# Patient Record
Sex: Male | Born: 1995
Health system: Southern US, Community
[De-identification: ages and names within clinical notes are randomized; demographics above are authoritative.]

## PROBLEM LIST (undated history)

## (undated) HISTORY — PX: OTHER SURGICAL HISTORY: SHX169

---
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
## 2016-07-18 ENCOUNTER — Ambulatory Visit
Admission: RE | Admit: 2016-07-18 | Discharge: 2016-07-18 | Disposition: A | Discharge status: Home / Self Care | Payer: No Typology Code available for payment source | Source: Ambulatory Visit | Attending: Occupational Medicine | Admitting: Occupational Medicine

## 2016-07-18 ENCOUNTER — Other Ambulatory Visit: Payer: Self-pay | Admitting: Occupational Medicine

## 2016-07-18 DIAGNOSIS — Z021 Encounter for pre-employment examination: Secondary | ICD-10-CM

## 2017-01-05 DIAGNOSIS — M9901 Segmental and somatic dysfunction of cervical region: Secondary | ICD-10-CM | POA: Diagnosis not present

## 2017-01-05 DIAGNOSIS — M9903 Segmental and somatic dysfunction of lumbar region: Secondary | ICD-10-CM | POA: Diagnosis not present

## 2017-01-05 DIAGNOSIS — M9902 Segmental and somatic dysfunction of thoracic region: Secondary | ICD-10-CM | POA: Diagnosis not present

## 2017-01-17 DIAGNOSIS — M9902 Segmental and somatic dysfunction of thoracic region: Secondary | ICD-10-CM | POA: Diagnosis not present

## 2017-01-17 DIAGNOSIS — M9901 Segmental and somatic dysfunction of cervical region: Secondary | ICD-10-CM | POA: Diagnosis not present

## 2017-01-17 DIAGNOSIS — M9903 Segmental and somatic dysfunction of lumbar region: Secondary | ICD-10-CM | POA: Diagnosis not present

## 2017-01-24 DIAGNOSIS — Z833 Family history of diabetes mellitus: Secondary | ICD-10-CM | POA: Diagnosis not present

## 2017-01-24 DIAGNOSIS — M25511 Pain in right shoulder: Secondary | ICD-10-CM | POA: Diagnosis not present

## 2017-02-07 DIAGNOSIS — M9902 Segmental and somatic dysfunction of thoracic region: Secondary | ICD-10-CM | POA: Diagnosis not present

## 2017-02-07 DIAGNOSIS — M9901 Segmental and somatic dysfunction of cervical region: Secondary | ICD-10-CM | POA: Diagnosis not present

## 2017-02-07 DIAGNOSIS — M9903 Segmental and somatic dysfunction of lumbar region: Secondary | ICD-10-CM | POA: Diagnosis not present

## 2017-03-16 IMAGING — CR DG CHEST 1V
1 series · 1 of 1 positions shown · non-contrast
Comparison: No priors.

CLINICAL DATA: 20-year-old male undergoing pre-employment
evaluation.

EXAM:
CHEST 1 VIEW

[w chest pa]
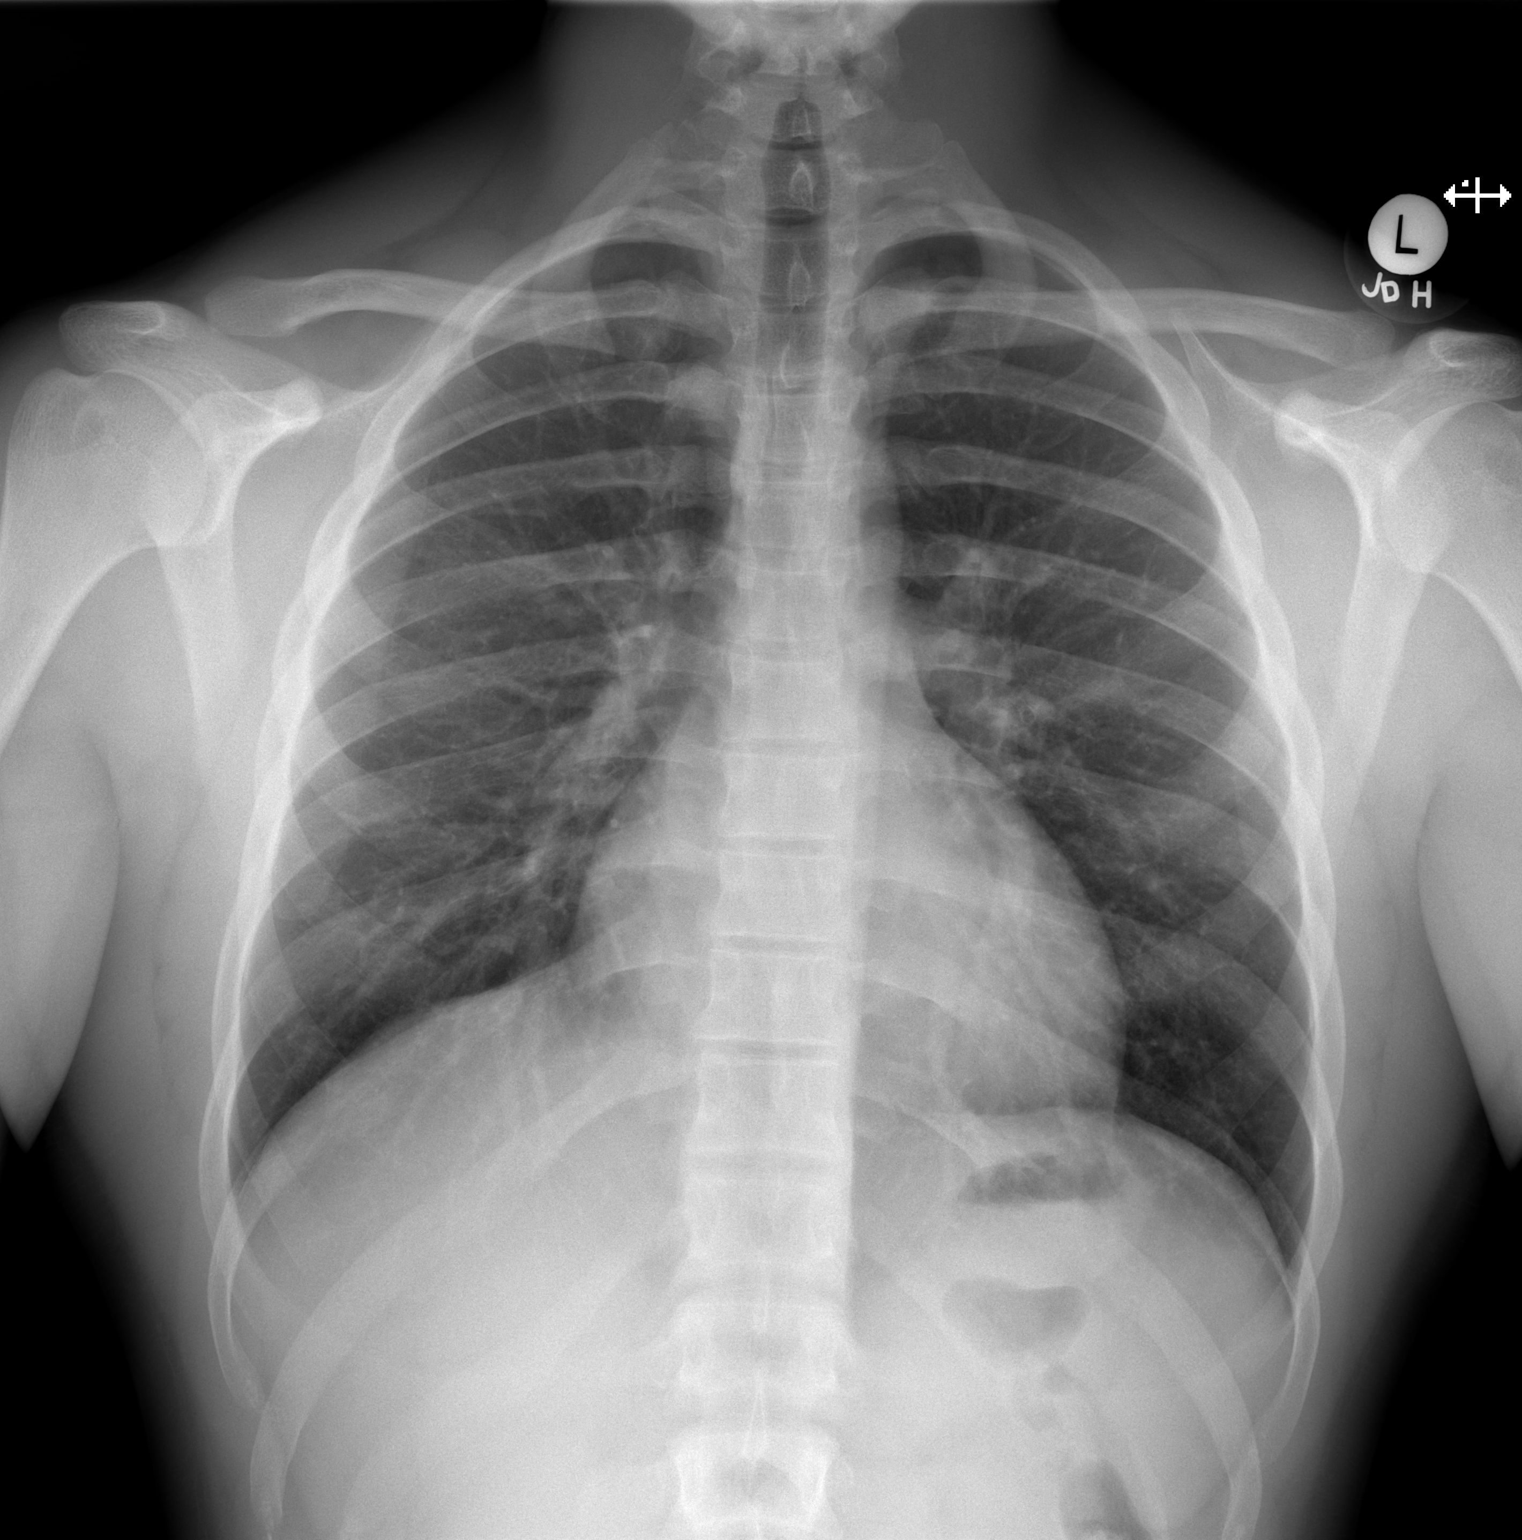

[1 of 1 positions shown; findings below may reference images not displayed]

FINDINGS: Lung volumes are normal. No consolidative airspace disease. No
pleural effusions. No pneumothorax. No pulmonary nodule or mass
noted. Pulmonary vasculature and the cardiomediastinal silhouette
are within normal limits.
IMPRESSION: No radiographic evidence of acute cardiopulmonary disease.

## 2020-01-13 DIAGNOSIS — M9901 Segmental and somatic dysfunction of cervical region: Secondary | ICD-10-CM | POA: Diagnosis not present

## 2020-01-16 DIAGNOSIS — M9901 Segmental and somatic dysfunction of cervical region: Secondary | ICD-10-CM | POA: Diagnosis not present

## 2020-01-20 DIAGNOSIS — M9901 Segmental and somatic dysfunction of cervical region: Secondary | ICD-10-CM | POA: Diagnosis not present

## 2020-01-23 DIAGNOSIS — M9901 Segmental and somatic dysfunction of cervical region: Secondary | ICD-10-CM | POA: Diagnosis not present

## 2020-04-07 DIAGNOSIS — M7541 Impingement syndrome of right shoulder: Secondary | ICD-10-CM | POA: Diagnosis not present

## 2020-04-07 DIAGNOSIS — M9907 Segmental and somatic dysfunction of upper extremity: Secondary | ICD-10-CM | POA: Diagnosis not present

## 2020-04-07 DIAGNOSIS — M9902 Segmental and somatic dysfunction of thoracic region: Secondary | ICD-10-CM | POA: Diagnosis not present

## 2020-04-07 DIAGNOSIS — M9901 Segmental and somatic dysfunction of cervical region: Secondary | ICD-10-CM | POA: Diagnosis not present

## 2021-07-01 ENCOUNTER — Encounter: Payer: Self-pay | Admitting: Nurse Practitioner

## 2021-07-01 NOTE — Progress Notes (Deleted)
Subjective:  Patient ID: Samuel Lucero, male    DOB: 1996-06-11  Age: 25 y.o. MRN: [id number]  No chief complaint on file.   HPI  Well Adult Physical: Patient here for a comprehensive physical exam.The patient reports {problems:16946} Do you take any herbs or supplements that were not prescribed by a doctor? {yes/no/not asked:9010} Are you taking calcium supplements? {yes/no:63} Are you taking aspirin daily? {yes/no:63}  Encounter for general adult medical examination without abnormal findings  Physical ("At Risk" items are starred): Patient's last physical exam was 1 year ago .  Smoking: Life-long non-smoker ;  Physical Activity: Exercises at least 3 times per week ;  Alcohol/Drug Use: Is a non-drinker ; No illicit drug use ;  Patient is not afflicted from Stress Incontinence and Urge Incontinence  Safety: reviewed ; Patient wears a seat belt, has smoke detectors, has carbon monoxide detectors, practices appropriate gun safety, and wears sunscreen with extended sun exposure. Dental Care: biannual cleanings, brushes and flosses daily. Ophthalmology/Optometry: Annual visit.  Hearing loss: none Vision impairments: none Last PSA:            Social History   Socioeconomic History   Marital status: Not on file    Spouse name: Not on file   Number of children: Not on file   Years of education: Not on file   Highest education level: Not on file  Occupational History   Not on file  Tobacco Use   Smoking status: Not on file   Smokeless tobacco: Not on file  Substance and Sexual Activity   Alcohol use: Not on file   Drug use: Not on file   Sexual activity: Not on file  Other Topics Concern   Not on file  Social History Narrative   ** Merged History Encounter **       Social Determinants of Health   Financial Resource Strain: Not on file  Food Insecurity: Not on file  Transportation Needs: Not on file  Physical Activity: Not on file  Stress: Not on file  Social  Connections: Not on file   No past medical history on file. *** The histories are not reviewed yet. Please review them in the "History" navigator section and refresh this SmartLink.  No family history on file. Social History   Socioeconomic History   Marital status: Not on file    Spouse name: Not on file   Number of children: Not on file   Years of education: Not on file   Highest education level: Not on file  Occupational History   Not on file  Tobacco Use   Smoking status: Not on file   Smokeless tobacco: Not on file  Substance and Sexual Activity   Alcohol use: Not on file   Drug use: Not on file   Sexual activity: Not on file  Other Topics Concern   Not on file  Social History Narrative   ** Merged History Encounter **       Social Determinants of Health   Financial Resource Strain: Not on file  Food Insecurity: Not on file  Transportation Needs: Not on file  Physical Activity: Not on file  Stress: Not on file  Social Connections: Not on file   Review of Systems  Constitutional:  Negative for appetite change, fatigue and fever.  HENT:  Negative for congestion, ear pain and sore throat.   Respiratory:  Negative for cough and shortness of breath.   Cardiovascular:  Negative for chest pain and leg  swelling.  Gastrointestinal:  Negative for abdominal pain, constipation, diarrhea, nausea and vomiting.  Genitourinary:  Negative for dysuria and frequency.  Musculoskeletal:  Negative for arthralgias and myalgias.  Neurological:  Negative for dizziness and headaches.  Psychiatric/Behavioral:  Negative for dysphoric mood. The patient is not nervous/anxious.     Objective:  There were no vitals taken for this visit.  No flowsheet data found.  Physical Exam Vitals reviewed.  Constitutional:      Appearance: Normal appearance.  HENT:     Right Ear: Tympanic membrane and external ear normal.     Left Ear: Tympanic membrane and external ear normal.     Nose: Nose  normal.     Mouth/Throat:     Mouth: Mucous membranes are moist.  Eyes:     Pupils: Pupils are equal, round, and reactive to light.  Cardiovascular:     Rate and Rhythm: Normal rate and regular rhythm.     Pulses: Normal pulses.     Heart sounds: Normal heart sounds.  Pulmonary:     Breath sounds: Normal breath sounds.  Abdominal:     General: Abdomen is flat. Bowel sounds are normal.     Palpations: Abdomen is soft.  Musculoskeletal:        General: Normal range of motion.     Cervical back: Normal range of motion.  Skin:    General: Skin is warm and dry.  Neurological:     Mental Status: He is alert and oriented to person, place, and time.  Psychiatric:        Mood and Affect: Mood normal.        Behavior: Behavior normal.    No results found for: WBC, HGB, HCT, PLT, GLUCOSE, CHOL, TRIG, HDL, LDLDIRECT, LDLCALC, ALT, AST, NA, K, CL, CREATININE, BUN, CO2, TSH, PSA, INR, GLUF, HGBA1C, MICROALBUR    Assessment & Plan:  There are no diagnoses linked to this encounter.   There is no height or weight on file to calculate BMI.   These are the goals we discussed:  Goals   None      This is a list of the screening recommended for you and due dates:  Health Maintenance  Topic Date Due   HPV Vaccine (1 - Male 2-dose series) Never done   HIV Screening  Never done   Hepatitis C Screening: USPSTF Recommendation to screen - Ages 30-79 yo.  Never done   Tetanus Vaccine  Never done   Flu Shot  07/26/2021   Pneumococcal Vaccination  Aged Out     AN INDIVIDUALIZED CARE PLAN: was established or reinforced today.   SELF MANAGEMENT: The patient and I together assessed ways to personally work towards obtaining the recommended goals  Support needs The patient and/or family needs were assessed and services were offered if appropriate.  No orders of the defined types were placed in this encounter.    Follow-up: No follow-ups on file.  An After Visit Summary was printed and given  to the patient.    I,Bethann Qualley M Neema Fluegge,acting as a Neurosurgeon for [employer], NP.,have documented all relevant documentation on the behalf of Samuel Morning, NP,as directed by  Samuel Morning, NP while in the presence of Samuel Morning, NP.   Samuel Morning, NP [facility] [phone]

## 2021-07-02 ENCOUNTER — Ambulatory Visit (INDEPENDENT_AMBULATORY_CARE_PROVIDER_SITE_OTHER): Payer: BC Managed Care – PPO | Admitting: Nurse Practitioner

## 2021-07-02 ENCOUNTER — Other Ambulatory Visit: Payer: Self-pay

## 2021-07-02 ENCOUNTER — Encounter: Payer: Self-pay | Admitting: Nurse Practitioner

## 2021-07-02 VITALS — BP 110/62 | HR 81 | Temp 97.8°F | Ht 67.0 in | Wt 198.0 lb

## 2021-07-02 DIAGNOSIS — H6123 Impacted cerumen, bilateral: Secondary | ICD-10-CM

## 2021-07-02 DIAGNOSIS — Z0000 Encounter for general adult medical examination without abnormal findings: Secondary | ICD-10-CM | POA: Diagnosis not present

## 2021-07-02 NOTE — Patient Instructions (Signed)
Rest, ice, elevate left knee Apply Voltaren gel to left knee 4 times daily for pain May wear compression sleeve to left knee Tylenol/Ibuprofen as needed for knee pain Follow-up 1 year  Health Maintenance, Male Adopting a healthy lifestyle and getting preventive care are important in promoting health and wellness. Ask your health care provider about: The right schedule for you to have regular tests and exams. Things you can do on your own to prevent diseases and keep yourself healthy. What should I know about diet, weight, and exercise? Eat a healthy diet  Eat a diet that includes plenty of vegetables, fruits, low-fat dairy products, and lean protein. Do not eat a lot of foods that are high in solid fats, added sugars, or sodium.  Maintain a healthy weight Body mass index (BMI) is a measurement that can be used to identify possible weight problems. It estimates body fat based on height and weight. Your health care provider can help determine your BMI and help you achieve or maintain ahealthy weight. Get regular exercise Get regular exercise. This is one of the most important things you can do for your health. Most adults should: Exercise for at least 150 minutes each week. The exercise should increase your heart rate and make you sweat (moderate-intensity exercise). Do strengthening exercises at least twice a week. This is in addition to the moderate-intensity exercise. Spend less time sitting. Even light physical activity can be beneficial. Watch cholesterol and blood lipids Have your blood tested for lipids and cholesterol at 25 years of age, then havethis test every 5 years. You may need to have your cholesterol levels checked more often if: Your lipid or cholesterol levels are high. You are older than 25 years of age. You are at high risk for heart disease. What should I know about cancer screening? Many types of cancers can be detected early and may often be prevented. Depending on  your health history and family history, you may need to have cancer screening at various ages. This may include screening for: Colorectal cancer. Prostate cancer. Skin cancer. Lung cancer. What should I know about heart disease, diabetes, and high blood pressure? Blood pressure and heart disease High blood pressure causes heart disease and increases the risk of stroke. This is more likely to develop in people who have high blood pressure readings, are of African descent, or are overweight. Talk with your health care provider about your target blood pressure readings. Have your blood pressure checked: Every 3-5 years if you are 33-44 years of age. Every year if you are 61 years old or older. If you are between the ages of 45 and 61 and are a current or former smoker, ask your health care provider if you should have a one-time screening for abdominal aortic aneurysm (AAA). Diabetes Have regular diabetes screenings. This checks your fasting blood sugar level. Have the screening done: Once every three years after age 59 if you are at a normal weight and have a low risk for diabetes. More often and at a younger age if you are overweight or have a high risk for diabetes. What should I know about preventing infection? Hepatitis B If you have a higher risk for hepatitis B, you should be screened for this virus. Talk with your health care provider to find out if you are at risk forhepatitis B infection. Hepatitis C Blood testing is recommended for: Everyone born from [birth year] through 1965. Anyone with known risk factors for hepatitis C. Sexually transmitted infections (STIs)  You should be screened each year for STIs, including gonorrhea and chlamydia, if: You are sexually active and are younger than 25 years of age. You are older than 25 years of age and your health care provider tells you that you are at risk for this type of infection. Your sexual activity has changed since you were last screened,  and you are at increased risk for chlamydia or gonorrhea. Ask your health care provider if you are at risk. Ask your health care provider about whether you are at high risk for HIV. Your health care provider may recommend a prescription medicine to help prevent HIV infection. If you choose to take medicine to prevent HIV, you should first get tested for HIV. You should then be tested every 3 months for as long as you are taking the medicine. Follow these instructions at home: Lifestyle Do not use any products that contain nicotine or tobacco, such as cigarettes, e-cigarettes, and chewing tobacco. If you need help quitting, ask your health care provider. Do not use street drugs. Do not share needles. Ask your health care provider for help if you need support or information about quitting drugs. Alcohol use Do not drink alcohol if your health care provider tells you not to drink. If you drink alcohol: Limit how much you have to 0-2 drinks a day. Be aware of how much alcohol is in your drink. In the [place] one drink equals one 12 oz bottle of beer (355 mL), one 5 oz glass of wine (148 mL), or one 1 oz glass of hard liquor (44 mL). General instructions Schedule regular health, dental, and eye exams. Stay current with your vaccines. Tell your health care provider if: You often feel depressed. You have ever been abused or do not feel safe at home. Summary Adopting a healthy lifestyle and getting preventive care are important in promoting health and wellness. Follow your health care provider's instructions about healthy diet, exercising, and getting tested or screened for diseases. Follow your health care provider's instructions on monitoring your cholesterol and blood pressure. This information is not intended to replace advice given to you by your health care provider. Make sure you discuss any questions you have with your healthcare provider. Document Revised: 12/05/2018 Document Reviewed:  12/05/2018 Elsevier Patient Education  2022 [employer]. Preventive Care 69-37 Years Old, Male Preventive care refers to lifestyle choices and visits with your health care provider that can promote health and wellness. This includes: A yearly physical exam. This is also called an annual wellness visit. Regular dental and eye exams. Immunizations. Screening for certain conditions. Healthy lifestyle choices, such as: Eating a healthy diet. Getting regular exercise. Not using drugs or products that contain nicotine and tobacco. Limiting alcohol use. What can I expect for my preventive care visit? Physical exam Your health care provider may check your: Height and weight. These may be used to calculate your BMI (body mass index). BMI is a measurement that tells if you are at a healthy weight. Heart rate and blood pressure. Body temperature. Skin for abnormal spots. Counseling Your health care provider may ask you questions about your: Past medical problems. Family's medical history. Alcohol, tobacco, and drug use. Emotional well-being. Home life and relationship well-being. Sexual activity. Diet, exercise, and sleep habits. Work and work Astronomer. Access to firearms. What immunizations do I need?  Vaccines are usually given at various ages, according to a schedule. Your health care provider will recommend vaccines for you based on your age, medicalhistory,  and lifestyle or other factors, such as travel or where you work. What tests do I need? Blood tests Lipid and cholesterol levels. These may be checked every 5 years starting at age 28. Hepatitis C test. Hepatitis B test. Screening  Diabetes screening. This is done by checking your blood sugar (glucose) after you have not eaten for a while (fasting). Genital exam to check for testicular cancer or hernias. STD (sexually transmitted disease) testing, if you are at risk. Talk with your health care provider about your test  results, treatment options,and if necessary, the need for more tests. Follow these instructions at home: Eating and drinking  Eat a healthy diet that includes fresh fruits and vegetables, whole grains, lean protein, and low-fat dairy products. Drink enough fluid to keep your urine pale yellow. Take vitamin and mineral supplements as recommended by your health care provider. Do not drink alcohol if your health care provider tells you not to drink. If you drink alcohol: Limit how much you have to 0-2 drinks a day. Be aware of how much alcohol is in your drink. In the [place] one drink equals one 12 oz bottle of beer (355 mL), one 5 oz glass of wine (148 mL), or one 1 oz glass of hard liquor (44 mL).  Lifestyle Take daily care of your teeth and gums. Brush your teeth every morning and night with fluoride toothpaste. Floss one time each day. Stay active. Exercise for at least 30 minutes 5 or more days each week. Do not use any products that contain nicotine or tobacco, such as cigarettes, e-cigarettes, and chewing tobacco. If you need help quitting, ask your health care provider. Do not use drugs. If you are sexually active, practice safe sex. Use a condom or other form of protection to prevent STIs (sexually transmitted infections). Find healthy ways to cope with stress, such as: Meditation, yoga, or listening to music. Journaling. Talking to a trusted person. Spending time with friends and family. Safety Always wear your seat belt while driving or riding in a vehicle. Do not drive: If you have been drinking alcohol. Do not ride with someone who has been drinking. When you are tired or distracted. While texting. Wear a helmet and other protective equipment during sports activities. If you have firearms in your house, make sure you follow all gun safety procedures. Seek help if you have been physically or sexually abused. What's next? Go to your health care provider once a year for an  annual wellness visit. Ask your health care provider how often you should have your eyes and teeth checked. Stay up to date on all vaccines. This information is not intended to replace advice given to you by your health care provider. Make sure you discuss any questions you have with your healthcare provider. Document Revised: 08/28/2019 Document Reviewed: 12/06/2018 Elsevier Patient Education  2022 [employer].

## 2021-07-02 NOTE — Progress Notes (Signed)
Subjective:  Patient ID: Samuel Lucero, male    DOB: October 20, 1996  Age: 25 y.o. MRN: [id number]  Chief Complaint  Patient presents with   Annual Exam    Physical paperwork for Fostering/Adopting    HPI  Well Adult Physical: Patient here for a comprehensive physical exam.The patient reports no chronic medical problems. He does tell me that he is experiencing left knee pain and swelling after playing basketball with his church group this morning. States he had left ACL repair as a teenager.  Do you take any herbs or supplements that were not prescribed by a doctor? no Are you taking calcium supplements? no Are you taking aspirin daily? no  Encounter for general adult medical examination without abnormal findings  Physical ("At Risk" items are starred): Patient's last physical exam was 1 year ago .  Smoking: Life-long non-smoker ;  Physical Activity: Exercises at least 3 times per week ;  Alcohol/Drug Use: Is a non-drinker ; No illicit drug use ;  Patient is not afflicted from Stress Incontinence and Urge Incontinence  Safety: reviewed ; Patient wears a seat belt, has smoke detectors, has carbon monoxide detectors, practices appropriate gun safety, and wears sunscreen with extended sun exposure. Dental Care: biannual cleanings, brushes and flosses daily. Ophthalmology/Optometry: Annual visit.  Hearing loss: none Vision impairments: none Last PSA:  Flowsheet Row Office Visit from 07/02/2021 in [facility]  PHQ-2 Total Score 0               Social History   Socioeconomic History   Marital status: Single    Spouse name: Not on file   Number of children: Not on file   Years of education: Not on file   Highest education level: Not on file  Occupational History   Not on file  Tobacco Use   Smoking status: Never    Passive exposure: Never   Smokeless tobacco: Never  Vaping Use   Vaping Use: Never used  Substance and Sexual Activity   Alcohol use: Never   Drug use: Never    Sexual activity: Not on file  Other Topics Concern   Not on file  Social History Narrative   ** Merged History Encounter **       Social Determinants of Health   Financial Resource Strain: Not on file  Food Insecurity: Not on file  Transportation Needs: Not on file  Physical Activity: Not on file  Stress: Not on file  Social Connections: Not on file   History reviewed. No pertinent past medical history. Past Surgical History:  Procedure Laterality Date   Left knee arthroscopy Left    10/25/2012   Tubes in ears Bilateral 1999    Family History  Problem Relation Age of Onset   Depression Mother    Diabetes Mother    Neuropathy Mother    Cushing syndrome Mother    Kidney failure Father    Social History   Socioeconomic History   Marital status: Single    Spouse name: Not on file   Number of children: Not on file   Years of education: Not on file   Highest education level: Not on file  Occupational History   Not on file  Tobacco Use   Smoking status: Never    Passive exposure: Never   Smokeless tobacco: Never  Vaping Use   Vaping Use: Never used  Substance and Sexual Activity   Alcohol use: Never   Drug use: Never   Sexual activity: Not  on file  Other Topics Concern   Not on file  Social History Narrative   ** Merged History Encounter **       Social Determinants of Health   Financial Resource Strain: Not on file  Food Insecurity: Not on file  Transportation Needs: Not on file  Physical Activity: Not on file  Stress: Not on file  Social Connections: Not on file   Review of Systems  Constitutional:  Negative for appetite change, fatigue and fever.  HENT:  Negative for congestion, ear pain, sinus pain and sore throat.   Eyes: Negative.   Respiratory:  Negative for cough and shortness of breath.   Cardiovascular:  Negative for chest pain and leg swelling.  Gastrointestinal:  Negative for abdominal pain, constipation, diarrhea, nausea and vomiting.   Genitourinary:  Negative for dysuria and frequency.  Musculoskeletal:  Positive for arthralgias (left knee) and joint swelling (left knee). Negative for myalgias.  Skin: Negative.   Allergic/Immunologic: Negative.   Neurological:  Negative for dizziness and headaches.  Hematological: Negative.   Psychiatric/Behavioral:  Negative for dysphoric mood. The patient is not nervous/anxious.     Objective:  BP 110/62 (BP Location: Left Arm, Patient Position: Sitting)   Pulse 81   Temp 97.8 F (36.6 C) (Temporal)   Ht 5\' 7"  (1.702 m)   Wt 198 lb (89.8 kg)   SpO2 98%   BMI 31.01 kg/m   BP/Weight 07/02/2021  Systolic BP 110  Diastolic BP 62  Wt. (Lbs) 198  BMI 31.01    Physical Exam Vitals reviewed.  Constitutional:      Appearance: Normal appearance.  HENT:     Right Ear: External ear normal. There is impacted cerumen.     Left Ear: External ear normal. There is impacted cerumen.     Nose: Nose normal.     Mouth/Throat:     Mouth: Mucous membranes are moist.  Eyes:     Pupils: Pupils are equal, round, and reactive to light.  Cardiovascular:     Rate and Rhythm: Normal rate and regular rhythm.     Pulses: Normal pulses.     Heart sounds: Normal heart sounds.  Pulmonary:     Effort: Pulmonary effort is normal.     Breath sounds: Normal breath sounds.  Abdominal:     General: Bowel sounds are normal.     Palpations: Abdomen is soft.  Musculoskeletal:        General: Tenderness (left knee) present. Normal range of motion.     Cervical back: Normal range of motion.     Right lower leg: Edema (left knee) present.  Skin:    General: Skin is warm and dry.     Capillary Refill: Capillary refill takes less than 2 seconds.  Neurological:     General: No focal deficit present.     Mental Status: He is alert and oriented to person, place, and time.  Psychiatric:        Mood and Affect: Mood normal.        Behavior: Behavior normal.        Assessment & Plan:    1. Annual  physical exam - Comprehensive metabolic panel - CBC with Differential/Platelet  2. Impacted cerumen of both ears - Ear wax removal    Body mass index is 31.01 kg/m.   These are the goals we discussed:weight loss, 10-15 pounds     This is a list of the screening recommended for you and due dates:  Health Maintenance  Topic Date Due   HPV Vaccine (1 - Male 2-dose series) Never done   HIV Screening  Never done   Hepatitis C Screening: USPSTF Recommendation to screen - Ages 13-79 yo.  Never done   Flu Shot  07/26/2021   Tetanus Vaccine  06/11/2024   Pneumococcal Vaccination  Aged Out     AN INDIVIDUALIZED CARE PLAN: was established or reinforced today.   SELF MANAGEMENT: The patient and I together assessed ways to personally work towards obtaining the recommended goals  Support needs The patient and/or family needs were assessed and services were offered if appropriate.      Follow-up: 1-year  An After Visit Summary was printed and given to the patient.    I,Lauren M Auman,acting as a Neurosurgeon for [employer], NP.,have documented all relevant documentation on the behalf of Janie Morning, NP,as directed by  Janie Morning, NP while in the presence of Janie Morning, NP.   I, Janie Morning, NP, have reviewed all documentation for this visit. The documentation on 07/02/21 for the exam, diagnosis, procedures, and orders are all accurate and complete.   Janie Morning, NP [facility] [phone]

## 2021-07-03 LAB — COMPREHENSIVE METABOLIC PANEL
ALT: 26 IU/L (ref 0–44)
AST: 24 IU/L (ref 0–40)
Albumin/Globulin Ratio: 1.8 (ref 1.2–2.2)
Albumin: 4.9 g/dL (ref 4.1–5.2)
Alkaline Phosphatase: 84 IU/L (ref 44–121)
BUN/Creatinine Ratio: 15 (ref 9–20)
BUN: 20 mg/dL (ref 6–20)
Bilirubin Total: 0.2 mg/dL (ref 0.0–1.2)
CO2: 23 mmol/L (ref 20–29)
Calcium: 9.7 mg/dL (ref 8.7–10.2)
Chloride: 100 mmol/L (ref 96–106)
Creatinine, Ser: 1.34 mg/dL — ABNORMAL HIGH (ref 0.76–1.27)
Globulin, Total: 2.8 g/dL (ref 1.5–4.5)
Glucose: 84 mg/dL (ref 65–99)
Potassium: 4.9 mmol/L (ref 3.5–5.2)
Sodium: 139 mmol/L (ref 134–144)
Total Protein: 7.7 g/dL (ref 6.0–8.5)
eGFR: 75 mL/min/{1.73_m2} (ref >59)

## 2021-07-03 LAB — CBC WITH DIFFERENTIAL/PLATELET
Basophils Absolute: 0 10*3/uL (ref 0.0–0.2)
Basos: 0 %
EOS (ABSOLUTE): 0 10*3/uL (ref 0.0–0.4)
Eos: 0 %
Hematocrit: 41 % (ref 37.5–51.0)
Hemoglobin: 13.3 g/dL (ref 13.0–17.7)
Immature Grans (Abs): 0.1 10*3/uL (ref 0.0–0.1)
Immature Granulocytes: 1 %
Lymphocytes Absolute: 1.5 10*3/uL (ref 0.7–3.1)
Lymphs: 14 %
MCH: 28.1 pg (ref 26.6–33.0)
MCHC: 32.4 g/dL (ref 31.5–35.7)
MCV: 87 fL (ref 79–97)
Monocytes Absolute: 0.9 10*3/uL (ref 0.1–0.9)
Monocytes: 8 %
Neutrophils Absolute: 8.3 10*3/uL — ABNORMAL HIGH (ref 1.4–7.0)
Neutrophils: 77 %
Platelets: 259 10*3/uL (ref 150–450)
RBC: 4.74 x10E6/uL (ref 4.14–5.80)
RDW: 13 % (ref 11.6–15.4)
WBC: 10.8 10*3/uL (ref 3.4–10.8)

## 2022-03-24 ENCOUNTER — Ambulatory Visit: Payer: BC Managed Care – PPO | Admitting: Nurse Practitioner

## 2022-03-29 ENCOUNTER — Ambulatory Visit: Payer: BC Managed Care – PPO | Admitting: Nurse Practitioner

## 2022-03-29 ENCOUNTER — Encounter: Payer: Self-pay | Admitting: Nurse Practitioner

## 2022-03-29 VITALS — BP 122/80 | HR 72 | Temp 97.4°F | Ht 68.0 in | Wt 198.0 lb

## 2022-03-29 DIAGNOSIS — Z5689 Other problems related to employment: Secondary | ICD-10-CM | POA: Diagnosis not present

## 2022-03-29 NOTE — Progress Notes (Signed)
? ?Subjective:  ?Patient ID: Samuel Lucero, male    DOB: 10-20-1996  Age: 26 y.o. MRN: [id number] ? ?CC: ?Employment form ? ?HPI ? Samuel Lucero is a 26 year old Caucasian male that presents for employment form completion. He is currently employed with [employer]. 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 He denies any acute or chronic medical problems. Last CPE 07/02/21, normal. All immunizations are up-to-date.  ? ? ?No current outpatient medications on file prior to visit.  ? ?No current facility-administered medications on file prior to visit.  ? ?No past medical history on file. ?Past Surgical History:  ?Procedure Laterality Date  ? Left knee arthroscopy Left   ? 10/25/2012  ? Tubes in ears Bilateral 1999  ?  ?Family History  ?Problem Relation Age of Onset  ? Depression Mother   ? Diabetes Mother   ? Neuropathy Mother   ? Cushing syndrome Mother   ? Kidney failure Father   ? ?Social History  ? ?Socioeconomic History  ? Marital status: Single  ?  Spouse name: Not on file  ? Number of children: Not on file  ? Years of education: Not on file  ? Highest education level: Not on file  ?Occupational History  ? Not on file  ?Tobacco Use  ? Smoking status: Never  ?  Passive exposure: Never  ? Smokeless tobacco: Never  ?Vaping Use  ? Vaping Use: Never used  ?Substance and Sexual Activity  ? Alcohol use: Never  ? Drug use: Never  ? Sexual activity: Not on file  ?Other Topics Concern  ? Not on file  ?Social History Narrative  ? ** Merged History Encounter **  ?    ? ?Social Determinants of Health  ? ?Financial Resource Strain: Not on file  ?Food Insecurity: Not on file  ?Transportation Needs: Not on file  ?Physical Activity: Not on file  ?Stress: Not on file  ?Social Connections: Not on file  ? ? ?Review of Systems  ?Constitutional:  Negative for appetite change, fatigue and unexpected weight change.  ?HENT:  Negative for congestion, ear pain, rhinorrhea, sinus pressure, sinus pain and  tinnitus.   ?Eyes:  Negative for pain.  ?Respiratory:  Negative for cough and shortness of breath.   ?Cardiovascular:  Negative for chest pain, palpitations and leg swelling.  ?Gastrointestinal:  Negative for abdominal pain, constipation, diarrhea, nausea and vomiting.  ?Endocrine: Negative for cold intolerance, heat intolerance, polydipsia, polyphagia and polyuria.  ?Genitourinary:  Negative for dysuria, frequency and hematuria.  ?Musculoskeletal:  Negative for arthralgias, back pain, joint swelling and myalgias.  ?Skin:  Negative for rash.  ?Allergic/Immunologic: Negative for environmental allergies.  ?Neurological:  Negative for dizziness and headaches.  ?Hematological:  Negative for adenopathy.  ?Psychiatric/Behavioral:  Negative for decreased concentration and sleep disturbance. The patient is not nervous/anxious.   ? ? ?Objective:  ?BP 122/80   Pulse 72   Temp (!) 97.4 ?F (36.3 ?C)   Ht 5\' 8"  (1.727 m)   Wt 198 lb (89.8 kg)   SpO2 99%   BMI 30.11 kg/m?   ? ? ?  07/02/2021  ?  9:23 AM  ?BP/Weight  ?Systolic BP 110  ?Diastolic BP 62  ?Wt. (Lbs) 198  ?BMI 31.01 kg/m2  ? ? ?Physical Exam ?Vitals reviewed.  ?Constitutional:   ?   Appearance: Normal appearance.  ?Skin: ?   General: Skin is warm and dry.  ?   Capillary Refill: Capillary refill takes less than 2 seconds.  ?  Neurological:  ?   General: No focal deficit present.  ?   Mental Status: He is alert and oriented to person, place, and time.  ?Psychiatric:     ?   Mood and Affect: Mood normal.     ?   Behavior: Behavior normal.  ? ? ?  ? ?Lab Results  ?Component Value Date  ? WBC 10.8 07/02/2021  ? HGB 13.3 07/02/2021  ? HCT 41.0 07/02/2021  ? PLT 259 07/02/2021  ? GLUCOSE 84 07/02/2021  ? ALT 26 07/02/2021  ? AST 24 07/02/2021  ? NA 139 07/02/2021  ? K 4.9 07/02/2021  ? CL 100 07/02/2021  ? CREATININE 1.34 (H) 07/02/2021  ? BUN 20 07/02/2021  ? CO2 23 07/02/2021  ? ? ? ? ?Assessment & Plan:  ? ? ? ? ?  ? ?Follow-up: PRN ? ?An After Visit Summary was printed  and given to the patient. ? ?I, Janie Morning, NP, have reviewed all documentation for this visit. The documentation on 03/29/22 for the exam, diagnosis, procedures, and orders are all accurate and complete.  ? ? ?Signed, ?Janie Morning, NP ?[facility] ?[phone]

## 2022-06-14 NOTE — Progress Notes (Deleted)
    Aleen Sells D.[facility] [address] [place] [address] Phone: [phone]   Assessment and Plan:     There are no diagnoses linked to this encounter.  ***   Pertinent previous records reviewed include ***   Follow Up: ***     Subjective:   I, Fard Borunda, am serving as a Neurosurgeon for Doctor Richardean Sale  Chief Complaint: knee hip and and ankle   HPI:   06/15/2022 Patient is  26 year old male complaining of knee, hip, and ankle pain. Patient states  Relevant Historical Information: ***  Additional pertinent review of systems negative.  No current outpatient medications on file.   Objective:     There were no vitals filed for this visit.    There is no height or weight on file to calculate BMI.    Physical Exam:    ***   Electronically signed by:  Aleen Sells [facility] 8:10 AM 06/14/22

## 2022-06-15 ENCOUNTER — Ambulatory Visit: Payer: BC Managed Care – PPO | Admitting: Sports Medicine

## 2022-10-13 ENCOUNTER — Other Ambulatory Visit: Payer: Self-pay | Admitting: Family Medicine

## 2022-10-13 DIAGNOSIS — M2392 Unspecified internal derangement of left knee: Secondary | ICD-10-CM

## 2022-10-15 ENCOUNTER — Ambulatory Visit (INDEPENDENT_AMBULATORY_CARE_PROVIDER_SITE_OTHER): Payer: BC Managed Care – PPO

## 2022-10-15 DIAGNOSIS — M2392 Unspecified internal derangement of left knee: Secondary | ICD-10-CM

## 2022-10-15 DIAGNOSIS — M25562 Pain in left knee: Secondary | ICD-10-CM | POA: Diagnosis not present

## 2022-11-25 HISTORY — PX: KNEE SURGERY: SHX244

## 2023-05-18 ENCOUNTER — Encounter: Payer: Self-pay | Admitting: Physician Assistant

## 2023-05-18 ENCOUNTER — Ambulatory Visit: Payer: BC Managed Care – PPO | Admitting: Physician Assistant

## 2023-05-18 VITALS — BP 110/78 | HR 61 | Temp 97.3°F | Ht 67.0 in | Wt 212.0 lb

## 2023-05-18 DIAGNOSIS — Z0000 Encounter for general adult medical examination without abnormal findings: Secondary | ICD-10-CM

## 2023-05-18 NOTE — Progress Notes (Signed)
0  Subjective:  Patient ID: Samuel Lucero, male    DOB: Mar 03, 1996  Age: 27 y.o. MRN: [id number]  Chief Complaint  Patient presents with   Annual Exam    HPI Encounter for general adult medical examination without abnormal findings  Physical ("At Risk" items are starred): Patient's last physical exam was 1 year ago .  Patient denies any current medical questions or issues.  States that he feels he eats healthy and exercises is much as again raising his 4 kids.  Patient will follow-up for a physical exam within 1 year.  Discussed counseling and if he has any other questions or concerns at any time.  [employer] can only be used for patients less than 37 years old.   SDOH Screenings   Food Insecurity: No Food Insecurity (05/18/2023)  Housing: Low Risk  (05/18/2023)  Transportation Needs: No Transportation Needs (05/18/2023)  Utilities: Not At Risk (05/18/2023)  Alcohol Screen: Low Risk  (05/18/2023)  Depression (PHQ2-9): Low Risk  (05/18/2023)  Financial Resource Strain: Low Risk  (05/18/2023)  Physical Activity: Insufficiently Active (05/18/2023)  Social Connections: Moderately Integrated (05/18/2023)  Stress: No Stress Concern Present (05/18/2023)  Tobacco Use: Low Risk  (05/18/2023)       05/18/2023   10:08 AM 07/02/2021    9:10 AM  Fall Risk   Falls in the past year? 0 0  Number falls in past yr: 0 0  Injury with Fall? 0 0  Risk for fall due to : No Fall Risks   Follow up Falls evaluation completed Falls evaluation completed       05/18/2023   10:09 AM 07/02/2021    9:17 AM  Depression screen PHQ 2/9  Decreased Interest 0 0  Down, Depressed, Hopeless 0 0  PHQ - 2 Score 0 0  Altered sleeping 0   Tired, decreased energy 0   Change in appetite 0   Feeling bad or failure about yourself  0   Trouble concentrating 0   Moving slowly or fidgety/restless 0   Suicidal thoughts 0   PHQ-9 Score 0   Difficult doing work/chores Not difficult at all     Functional Status  Survey: Is the patient deaf or have difficulty hearing?: No Does the patient have difficulty seeing, even when wearing glasses/contacts?: No Does the patient have difficulty concentrating, remembering, or making decisions?: No Does the patient have difficulty walking or climbing stairs?: No Does the patient have difficulty dressing or bathing?: No Does the patient have difficulty doing errands alone such as visiting a doctor's office or shopping?: No   Safety: reviewed ;  Patient wears a seat belt. Patient's home has smoke detectors and carbon monoxide detectors. Patient practices appropriate gun safety Patient wears sunscreen with extended sun exposure. Dental Care: biannual cleanings, brushes and flosses daily. Ophthalmology/Optometry: Annual visit.  Hearing loss: none Vision impairments: none   No current outpatient medications on file prior to visit.   No current facility-administered medications on file prior to visit.    Social Hx   Social History   Socioeconomic History   Marital status: Married    Spouse name: Casimiro Needle   Number of children: 4   Years of education: Not on file   Highest education level: Not on file  Occupational History   Not on file  Tobacco Use   Smoking status: Never    Passive exposure: Never   Smokeless tobacco: Never  Vaping Use   Vaping Use: Never used  Substance and  Sexual Activity   Alcohol use: Never   Drug use: Never   Sexual activity: Yes    Partners: Female  Other Topics Concern   Not on file  Social History Narrative   ** Merged History Encounter **       Social Determinants of Health   Financial Resource Strain: Low Risk  (05/18/2023)   Overall Financial Resource Strain (CARDIA)    Difficulty of Paying Living Expenses: Not hard at all  Food Insecurity: No Food Insecurity (05/18/2023)   Hunger Vital Sign    Worried About Running Out of Food in the Last Year: Never true    Ran Out of Food in the Last Year: Never true   Transportation Needs: No Transportation Needs (05/18/2023)   PRAPARE - Administrator, Civil Service (Medical): No    Lack of Transportation (Non-Medical): No  Physical Activity: Insufficiently Active (05/18/2023)   Exercise Vital Sign    Days of Exercise per Week: 3 days    Minutes of Exercise per Session: 30 min  Stress: No Stress Concern Present (05/18/2023)   [employer] of Occupational Health - Occupational Stress Questionnaire    Feeling of Stress : Not at all  Social Connections: Moderately Integrated (05/18/2023)   Social Connection and Isolation Panel [NHANES]    Frequency of Communication with Friends and Family: More than three times a week    Frequency of Social Gatherings with Friends and Family: More than three times a week    Attends Religious Services: More than 4 times per year    Active Member of [employer] or Organizations: No    Attends Banker Meetings: Never    Marital Status: Married   History reviewed. No pertinent past medical history. Family History  Problem Relation Age of Onset   Depression Mother    Diabetes Mother    Neuropathy Mother    Cushing syndrome Mother    Kidney failure Father     Review of Systems  Constitutional:  Negative for chills, diaphoresis, fatigue and fever.  HENT:  Negative for congestion, ear pain and sore throat.   Respiratory:  Negative for cough and shortness of breath.   Cardiovascular:  Negative for chest pain and leg swelling.  Gastrointestinal:  Negative for abdominal pain, constipation, diarrhea, nausea and vomiting.  Genitourinary:  Negative for dysuria and urgency.  Musculoskeletal:  Negative for arthralgias and myalgias.  Neurological:  Negative for dizziness and headaches.  Psychiatric/Behavioral:  Negative for dysphoric mood.      Objective:  BP 110/78   Pulse 61   Temp (!) 97.3 F (36.3 C)   Ht 5\' 7"  (1.702 m)   Wt 212 lb (96.2 kg)   SpO2 99%   BMI 33.20 kg/m      05/18/2023    10:04 AM 03/29/2022    2:32 PM 07/02/2021    9:23 AM  BP/Weight  Systolic BP 110 122 110  Diastolic BP 78 80 62  Wt. (Lbs) 212 198 198  BMI 33.2 kg/m2 30.11 kg/m2 31.01 kg/m2    Physical Exam Vitals reviewed.  Constitutional:      Appearance: Normal appearance.  HENT:     Right Ear: Tympanic membrane normal.     Left Ear: Tympanic membrane normal.     Nose: Nose normal.     Mouth/Throat:     Pharynx: No oropharyngeal exudate or posterior oropharyngeal erythema.  Eyes:     Conjunctiva/sclera: Conjunctivae normal.  Neck:     Vascular:  No carotid bruit.  Cardiovascular:     Rate and Rhythm: Normal rate and regular rhythm.     Pulses: Normal pulses.     Heart sounds: Normal heart sounds.  Pulmonary:     Effort: Pulmonary effort is normal.     Breath sounds: Normal breath sounds.  Abdominal:     General: Bowel sounds are normal.     Palpations: There is no mass.     Tenderness: There is no abdominal tenderness.  Musculoskeletal:     Cervical back: Normal range of motion.  Skin:    Findings: No lesion.  Neurological:     Mental Status: He is alert and oriented to person, place, and time.  Psychiatric:        Mood and Affect: Mood normal.        Behavior: Behavior normal.     Lab Results  Component Value Date   WBC 5.7 05/19/2023   HGB 13.4 05/19/2023   HCT 40.5 05/19/2023   PLT 264 05/19/2023   GLUCOSE 98 05/19/2023   CHOL 201 (H) 05/19/2023   TRIG 112 05/19/2023   HDL 53 05/19/2023   LDLCALC 128 (H) 05/19/2023   ALT 23 05/19/2023   AST 24 05/19/2023   NA 141 05/19/2023   K 4.9 05/19/2023   CL 104 05/19/2023   CREATININE 1.32 (H) 05/19/2023   BUN 17 05/19/2023   CO2 22 05/19/2023   TSH 2.230 05/19/2023      Assessment & Plan:  1. Routine medical exam - CBC with Differential/Platelet; Future - Comprehensive metabolic panel; Future - Lipid panel; Future - HIV Antibody (routine testing w rflx); Future - Hepatitis C antibody; Future    No orders of  the defined types were placed in this encounter.   These are the goals we discussed:  Goals   None      This is a list of the screening recommended for you and due dates:  Health Maintenance  Topic Date Due   Flu Shot  07/27/2023   DTaP/Tdap/Td vaccine (7 - Td or Tdap) 06/11/2024   Hepatitis C Screening  Completed   HIV Screening  Completed   HPV Vaccine  Aged Out   COVID-19 Vaccine  Discontinued      AN INDIVIDUALIZED CARE PLAN: was established or reinforced today.   SELF MANAGEMENT: The patient and I together assessed ways to personally work towards obtaining the recommended goals  Support needs The patient and/or family needs were assessed and services were offered and not necessary at this time.    Follow-up: Return in about 1 year (around 05/17/2024) for cpe, Huston Foley. Langley Gauss [facility] [phone]

## 2023-05-18 NOTE — Patient Instructions (Signed)

## 2023-05-18 NOTE — Assessment & Plan Note (Signed)
Things to do to keep yourself healthy  - Exercise at least 30-45 minutes a day, 3-4 days a week.  - Eat a low-fat diet with lots of fruits and vegetables, up to 7-9 servings per day.  - Seatbelts can save your life. Wear them always.  - Smoke detectors on every level of your home, check batteries every year.  - Eye Doctor - have an eye exam every 1-2 years  - Safe sex - if you may be exposed to STDs, use a condom.  - Alcohol -  If you drink, do it moderately, less than 2 drinks per day.  - Health Care Power of Attorney. Choose someone to speak for you if you are not able.  - Depression is common in our stressful world.If you're feeling down or losing interest in things you normally enjoy, please come in for a visit.  - Violence - If anyone is threatening or hurting you, please call immediately.  Patient will follow-up in 1 year for physical exam.  Set labs for future date due to the patient not fasting.

## 2023-05-19 ENCOUNTER — Other Ambulatory Visit: Payer: Self-pay | Admitting: Family Medicine

## 2023-05-19 ENCOUNTER — Other Ambulatory Visit: Payer: BC Managed Care – PPO

## 2023-05-19 DIAGNOSIS — Z0000 Encounter for general adult medical examination without abnormal findings: Secondary | ICD-10-CM

## 2023-05-20 LAB — CBC WITH DIFFERENTIAL/PLATELET
Basophils Absolute: 0 10*3/uL (ref 0.0–0.2)
Basos: 1 %
EOS (ABSOLUTE): 0.1 10*3/uL (ref 0.0–0.4)
Eos: 1 %
Hematocrit: 40.5 % (ref 37.5–51.0)
Hemoglobin: 13.4 g/dL (ref 13.0–17.7)
Immature Grans (Abs): 0 10*3/uL (ref 0.0–0.1)
Immature Granulocytes: 0 %
Lymphocytes Absolute: 1.7 10*3/uL (ref 0.7–3.1)
Lymphs: 30 %
MCH: 28.8 pg (ref 26.6–33.0)
MCHC: 33.1 g/dL (ref 31.5–35.7)
MCV: 87 fL (ref 79–97)
Monocytes Absolute: 0.5 10*3/uL (ref 0.1–0.9)
Monocytes: 9 %
Neutrophils Absolute: 3.3 10*3/uL (ref 1.4–7.0)
Neutrophils: 59 %
Platelets: 264 10*3/uL (ref 150–450)
RBC: 4.66 x10E6/uL (ref 4.14–5.80)
RDW: 13.4 % (ref 11.6–15.4)
WBC: 5.7 10*3/uL (ref 3.4–10.8)

## 2023-05-20 LAB — COMPREHENSIVE METABOLIC PANEL
ALT: 23 IU/L (ref 0–44)
AST: 24 IU/L (ref 0–40)
Albumin/Globulin Ratio: 1.6 (ref 1.2–2.2)
Albumin: 4.4 g/dL (ref 4.3–5.2)
Alkaline Phosphatase: 78 IU/L (ref 44–121)
BUN/Creatinine Ratio: 13 (ref 9–20)
BUN: 17 mg/dL (ref 6–20)
Bilirubin Total: <0.2 mg/dL (ref 0.0–1.2)
CO2: 22 mmol/L (ref 20–29)
Calcium: 9.1 mg/dL (ref 8.7–10.2)
Chloride: 104 mmol/L (ref 96–106)
Creatinine, Ser: 1.32 mg/dL — ABNORMAL HIGH (ref 0.76–1.27)
Globulin, Total: 2.7 g/dL (ref 1.5–4.5)
Glucose: 98 mg/dL (ref 70–99)
Potassium: 4.9 mmol/L (ref 3.5–5.2)
Sodium: 141 mmol/L (ref 134–144)
Total Protein: 7.1 g/dL (ref 6.0–8.5)
eGFR: 76 mL/min/{1.73_m2} (ref >59)

## 2023-05-20 LAB — LIPID PANEL
Chol/HDL Ratio: 3.8 ratio (ref 0.0–5.0)
Cholesterol, Total: 201 mg/dL — ABNORMAL HIGH (ref 100–199)
HDL: 53 mg/dL (ref >39)
LDL Chol Calc (NIH): 128 mg/dL — ABNORMAL HIGH (ref 0–99)
Triglycerides: 112 mg/dL (ref 0–149)
VLDL Cholesterol Cal: 20 mg/dL (ref 5–40)

## 2023-05-20 LAB — HIV ANTIBODY (ROUTINE TESTING W REFLEX): HIV Screen 4th Generation wRfx: NONREACTIVE

## 2023-05-20 LAB — CARDIOVASCULAR RISK ASSESSMENT

## 2023-05-20 LAB — HEPATITIS C ANTIBODY: Hep C Virus Ab: NONREACTIVE

## 2023-05-20 LAB — TSH: TSH: 2.23 u[IU]/mL (ref 0.450–4.500)

## 2024-05-17 NOTE — Progress Notes (Deleted)
 Subjective:  Patient ID: Samuel Lucero, male    DOB: [birth year]  Age: 28 y.o. MRN: [id number]  No chief complaint on file.  HPI:  Well Adult Physical: Patient here for a comprehensive physical exam.The patient reports {problems:16946} Do you take any herbs or supplements that were not prescribed by a doctor? {yes/no/not asked:9010} Are you taking calcium supplements? {yes/no:63} Are you taking aspirin daily? {yes/no:63}  Encounter for general adult medical examination without abnormal findings  Physical ("At Risk" items are starred): Patient's last physical exam was 1 year ago .  Patient wears a seat belt, has smoke detectors, has carbon monoxide detectors, practices appropriate gun safety, and wears sunscreen with extended sun exposure. Dental Care: biannual cleanings, brushes and flosses daily. Ophthalmology/Optometry: Annual visit.  Hearing loss: none Vision impairments: none Last PSA:     05/18/2023   10:09 AM 07/02/2021    9:17 AM  Depression screen PHQ 2/9  Decreased Interest 0 0  Down, Depressed, Hopeless 0 0  PHQ - 2 Score 0 0  Altered sleeping 0   Tired, decreased energy 0   Change in appetite 0   Feeling bad or failure about yourself  0   Trouble concentrating 0   Moving slowly or fidgety/restless 0   Suicidal thoughts 0   PHQ-9 Score 0   Difficult doing work/chores Not difficult at all          07/02/2021    9:10 AM 05/18/2023   10:08 AM  Fall Risk  Falls in the past year? 0 0  Was there an injury with Fall? 0 0  Fall Risk Category Calculator 0 0  Fall Risk Category (Retired) Low   (RETIRED) Patient Fall Risk Level Low fall risk   Patient at Risk for Falls Due to  No Fall Risks  Fall risk Follow up Falls evaluation completed Falls evaluation completed              No past medical history on file. Past Surgical History:  Procedure Laterality Date   KNEE SURGERY Left 11/2022   Left knee arthroscopy Left    10/25/2012   Tubes in ears Bilateral 1999     Family History  Problem Relation Age of Onset   Depression Mother    Diabetes Mother    Neuropathy Mother    Cushing syndrome Mother    Kidney failure Father    Social History   Socioeconomic History   Marital status: Married    Spouse name: Warner Haas   Number of children: 4   Years of education: Not on file   Highest education level: Not on file  Occupational History   Not on file  Tobacco Use   Smoking status: Never    Passive exposure: Never   Smokeless tobacco: Never  Vaping Use   Vaping status: Never Used  Substance and Sexual Activity   Alcohol use: Never   Drug use: Never   Sexual activity: Yes    Partners: Female  Other Topics Concern   Not on file  Social History Narrative   ** Merged History Encounter **       Social Drivers of Health   Financial Resource Strain: Low Risk  (05/18/2023)   Overall Financial Resource Strain (CARDIA)    Difficulty of Paying Living Expenses: Not hard at all  Food Insecurity: No Food Insecurity (05/18/2023)   Hunger Vital Sign    Worried About Running Out of Food in the Last Year: Never true  Ran Out of Food in the Last Year: Never true  Transportation Needs: No Transportation Needs (05/18/2023)   PRAPARE - Administrator, Civil Service (Medical): No    Lack of Transportation (Non-Medical): No  Physical Activity: Insufficiently Active (05/18/2023)   Exercise Vital Sign    Days of Exercise per Week: 3 days    Minutes of Exercise per Session: 30 min  Stress: No Stress Concern Present (05/18/2023)   [employer] of Occupational Health - Occupational Stress Questionnaire    Feeling of Stress : Not at all  Social Connections: Moderately Integrated (05/18/2023)   Social Connection and Isolation Panel [NHANES]    Frequency of Communication with Friends and Family: More than three times a week    Frequency of Social Gatherings with Friends and Family: More than three times a week    Attends Religious Services:  More than 4 times per year    Active Member of [employer] or Organizations: No    Attends Banker Meetings: Never    Marital Status: Married   Review of Systems   Objective:  There were no vitals taken for this visit.     05/18/2023   10:04 AM 03/29/2022    2:32 PM 07/02/2021    9:23 AM  BP/Weight  Systolic BP 110 122 110  Diastolic BP 78 80 62  Wt. (Lbs) 212 198 198  BMI 33.2 kg/m2 30.11 kg/m2 31.01 kg/m2    Physical Exam  Lab Results  Component Value Date   WBC 5.7 05/19/2023   HGB 13.4 05/19/2023   HCT 40.5 05/19/2023   PLT 264 05/19/2023   GLUCOSE 98 05/19/2023   CHOL 201 (H) 05/19/2023   TRIG 112 05/19/2023   HDL 53 05/19/2023   LDLCALC 128 (H) 05/19/2023   ALT 23 05/19/2023   AST 24 05/19/2023   NA 141 05/19/2023   K 4.9 05/19/2023   CL 104 05/19/2023   CREATININE 1.32 (H) 05/19/2023   BUN 17 05/19/2023   CO2 22 05/19/2023   TSH 2.230 05/19/2023      Assessment & Plan:  There are no diagnoses linked to this encounter.   There is no height or weight on file to calculate BMI.   These are the goals we discussed:  Goals   None      This is a list of the screening recommended for you and due dates:  Health Maintenance  Topic Date Due   DTaP/Tdap/Td vaccine (7 - Td or Tdap) 06/11/2024   Flu Shot  07/26/2024   Hepatitis C Screening  Completed   HIV Screening  Completed   HPV Vaccine  Aged Out   Meningitis B Vaccine  Aged Out   COVID-19 Vaccine  Discontinued     No orders of the defined types were placed in this encounter.    Follow-up: No follow-ups on file.  An After Visit Summary was printed and given to the patient.    I,Suhaib Guzzo M Mande Auvil,acting as a Neurosurgeon for [employer], [employer].,have documented all relevant documentation on the behalf of Odilia Bennett, PA,as directed by  Odilia Bennett, PA while in the presence of Odilia Bennett, [place].    Odilia Bennett, [place] [facility] [phone]

## 2024-05-21 ENCOUNTER — Encounter: Payer: BC Managed Care – PPO | Admitting: Physician Assistant

## 2024-05-21 DIAGNOSIS — Z0000 Encounter for general adult medical examination without abnormal findings: Secondary | ICD-10-CM
